# Patient Record
Sex: Male | Born: 1995 | Race: Black or African American | Hispanic: No | Marital: Single | Smoking: Never smoker
Health system: Southern US, Community
[De-identification: ages and names within clinical notes are randomized; demographics above are authoritative.]

---
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
## 2017-12-26 ENCOUNTER — Other Ambulatory Visit: Payer: Self-pay

## 2017-12-26 ENCOUNTER — Encounter (HOSPITAL_COMMUNITY): Payer: Self-pay | Admitting: Emergency Medicine

## 2017-12-26 ENCOUNTER — Observation Stay (HOSPITAL_COMMUNITY)
Admission: EM | Admit: 2017-12-26 | Discharge: 2017-12-27 | Disposition: A | Discharge status: Home / Self Care | Payer: Self-pay | Attending: Internal Medicine | Admitting: Internal Medicine

## 2017-12-26 DIAGNOSIS — E669 Obesity, unspecified: Secondary | ICD-10-CM

## 2017-12-26 DIAGNOSIS — Z9889 Other specified postprocedural states: Secondary | ICD-10-CM | POA: Insufficient documentation

## 2017-12-26 DIAGNOSIS — E0810 Diabetes mellitus due to underlying condition with ketoacidosis without coma: Secondary | ICD-10-CM

## 2017-12-26 DIAGNOSIS — N179 Acute kidney failure, unspecified: Secondary | ICD-10-CM | POA: Insufficient documentation

## 2017-12-26 DIAGNOSIS — Z23 Encounter for immunization: Secondary | ICD-10-CM | POA: Insufficient documentation

## 2017-12-26 DIAGNOSIS — E1110 Type 2 diabetes mellitus with ketoacidosis without coma: Secondary | ICD-10-CM

## 2017-12-26 DIAGNOSIS — E1010 Type 1 diabetes mellitus with ketoacidosis without coma: Principal | ICD-10-CM | POA: Insufficient documentation

## 2017-12-26 DIAGNOSIS — E860 Dehydration: Secondary | ICD-10-CM | POA: Insufficient documentation

## 2017-12-26 DIAGNOSIS — Z6841 Body Mass Index (BMI) 40.0 and over, adult: Secondary | ICD-10-CM | POA: Insufficient documentation

## 2017-12-26 DIAGNOSIS — E876 Hypokalemia: Secondary | ICD-10-CM | POA: Insufficient documentation

## 2017-12-26 LAB — COMPREHENSIVE METABOLIC PANEL
ALBUMIN: 4.9 g/dL (ref 3.5–5.0)
ALT: 45 U/L — ABNORMAL HIGH (ref 0–44)
AST: 25 U/L (ref 15–41)
Alkaline Phosphatase: 219 U/L — ABNORMAL HIGH (ref 38–126)
BUN: 29 mg/dL — ABNORMAL HIGH (ref 6–20)
CO2: 22 mmol/L (ref 22–32)
Calcium: 10.2 mg/dL (ref 8.9–10.3)
Chloride: 83 mmol/L — ABNORMAL LOW (ref 98–111)
Creatinine, Ser: 1.71 mg/dL — ABNORMAL HIGH (ref 0.61–1.24)
GFR calc non Af Amer: 56 mL/min — ABNORMAL LOW (ref >60)
GLUCOSE: 1174 mg/dL — AB (ref 70–99)
POTASSIUM: 6.5 mmol/L — AB (ref 3.5–5.1)
SODIUM: 127 mmol/L — AB (ref 135–145)
TOTAL PROTEIN: 8.8 g/dL — AB (ref 6.5–8.1)
Total Bilirubin: 1.4 mg/dL — ABNORMAL HIGH (ref 0.3–1.2)

## 2017-12-26 LAB — BASIC METABOLIC PANEL
Anion gap: >20 — ABNORMAL HIGH (ref 5–15)
Anion gap: 13 (ref 5–15)
Anion gap: 15 (ref 5–15)
BUN: 25 mg/dL — AB (ref 6–20)
BUN: 23 mg/dL — AB (ref 6–20)
BUN: 20 mg/dL (ref 6–20)
CALCIUM: 9.9 mg/dL (ref 8.9–10.3)
CO2: 24 mmol/L (ref 22–32)
CO2: 28 mmol/L (ref 22–32)
CO2: 25 mmol/L (ref 22–32)
Calcium: 9.8 mg/dL (ref 8.9–10.3)
Calcium: 9.2 mg/dL (ref 8.9–10.3)
Chloride: 90 mmol/L — ABNORMAL LOW (ref 98–111)
Chloride: 95 mmol/L — ABNORMAL LOW (ref 98–111)
Chloride: 97 mmol/L — ABNORMAL LOW (ref 98–111)
Creatinine, Ser: 1.44 mg/dL — ABNORMAL HIGH (ref 0.61–1.24)
Creatinine, Ser: 1.44 mg/dL — ABNORMAL HIGH (ref 0.61–1.24)
Creatinine, Ser: 1.1 mg/dL (ref 0.61–1.24)
GFR calc Af Amer: >60 mL/min (ref >60)
GFR calc Af Amer: >60 mL/min (ref >60)
GFR calc Af Amer: >60 mL/min (ref >60)
GLUCOSE: 413 mg/dL — AB (ref 70–99)
GLUCOSE: 287 mg/dL — AB (ref 70–99)
Glucose, Bld: 801 mg/dL (ref 70–99)
POTASSIUM: 4 mmol/L (ref 3.5–5.1)
POTASSIUM: 3.5 mmol/L (ref 3.5–5.1)
Potassium: 4.8 mmol/L (ref 3.5–5.1)
Sodium: 135 mmol/L (ref 135–145)
Sodium: 136 mmol/L (ref 135–145)
Sodium: 137 mmol/L (ref 135–145)

## 2017-12-26 LAB — CBC WITH DIFFERENTIAL/PLATELET
BASOS PCT: 0 %
Basophils Absolute: 0 10*3/uL (ref 0.0–0.1)
Eosinophils Absolute: 0.1 10*3/uL (ref 0.0–0.7)
Eosinophils Relative: 1 %
HEMATOCRIT: 47.9 % (ref 39.0–52.0)
HEMOGLOBIN: 17.6 g/dL — AB (ref 13.0–17.0)
LYMPHS ABS: 1.7 10*3/uL (ref 0.7–4.0)
Lymphocytes Relative: 25 %
MCH: 31 pg (ref 26.0–34.0)
MCHC: 36.7 g/dL — AB (ref 30.0–36.0)
MCV: 84.3 fL (ref 78.0–100.0)
MONOS PCT: 9 %
Monocytes Absolute: 0.6 10*3/uL (ref 0.1–1.0)
NEUTROS ABS: 4.4 10*3/uL (ref 1.7–7.7)
NEUTROS PCT: 65 %
Platelets: 276 10*3/uL (ref 150–400)
RBC: 5.68 MIL/uL (ref 4.22–5.81)
RDW: 12.2 % (ref 11.5–15.5)
WBC: 6.8 10*3/uL (ref 4.0–10.5)

## 2017-12-26 LAB — GLUCOSE, CAPILLARY
GLUCOSE-CAPILLARY: 297 mg/dL — AB (ref 70–99)
GLUCOSE-CAPILLARY: 302 mg/dL — AB (ref 70–99)
Glucose-Capillary: 277 mg/dL — ABNORMAL HIGH (ref 70–99)
Glucose-Capillary: 267 mg/dL — ABNORMAL HIGH (ref 70–99)
Glucose-Capillary: 248 mg/dL — ABNORMAL HIGH (ref 70–99)

## 2017-12-26 LAB — CBG MONITORING, ED
GLUCOSE-CAPILLARY: 528 mg/dL — AB (ref 70–99)
Glucose-Capillary: >600 mg/dL (ref 70–99)
Glucose-Capillary: 596 mg/dL (ref 70–99)

## 2017-12-26 LAB — URINALYSIS, ROUTINE W REFLEX MICROSCOPIC
BACTERIA UA: NONE SEEN
Bilirubin Urine: NEGATIVE
Hgb urine dipstick: NEGATIVE
Ketones, ur: 5 mg/dL — AB
Leukocytes, UA: NEGATIVE
NITRITE: NEGATIVE
PROTEIN: NEGATIVE mg/dL
Specific Gravity, Urine: 1.028 (ref 1.005–1.030)
pH: 6 (ref 5.0–8.0)

## 2017-12-26 LAB — MRSA PCR SCREENING: MRSA BY PCR: NEGATIVE

## 2017-12-26 LAB — LIPASE, BLOOD: Lipase: 31 U/L (ref 11–51)

## 2017-12-26 MED ORDER — SODIUM CHLORIDE 0.9 % IV BOLUS: 1000.0000 mL | Freq: Once | INTRAVENOUS | Status: AC

## 2017-12-26 MED ORDER — DEXTROSE-NACL 5-0.45 % IV SOLN: INTRAVENOUS | Status: DC

## 2017-12-26 MED ORDER — ACETAMINOPHEN 325 MG PO TABS: 650.0000 mg | ORAL_TABLET | Freq: Four times a day (QID) | ORAL | Status: DC | PRN

## 2017-12-26 MED ORDER — INSULIN ASPART 100 UNIT/ML IV SOLN: 10.0000 [IU] | Freq: Once | INTRAVENOUS | Status: AC

## 2017-12-26 MED ORDER — ONDANSETRON HCL 4 MG/2ML IJ SOLN
4.0000 mg | Freq: Once | INTRAMUSCULAR | Status: AC
  Administered 2017-12-26: 4 mg via INTRAVENOUS
  Filled 2017-12-26: qty 2

## 2017-12-26 MED ORDER — ONDANSETRON HCL 4 MG/2ML IJ SOLN: 4.0000 mg | Freq: Four times a day (QID) | INTRAMUSCULAR | Status: DC | PRN

## 2017-12-26 MED ORDER — ONDANSETRON HCL 4 MG PO TABS: 4.0000 mg | ORAL_TABLET | Freq: Four times a day (QID) | ORAL | Status: DC | PRN

## 2017-12-26 MED ORDER — HEPARIN SODIUM (PORCINE) 5000 UNIT/ML IJ SOLN
5000.0000 [IU] | Freq: Three times a day (TID) | INTRAMUSCULAR | Status: DC
Start: 2017-12-26 — End: 2017-12-27
  Administered 2017-12-26: 5000 [IU] via SUBCUTANEOUS
  Filled 2017-12-26: qty 1

## 2017-12-26 MED ORDER — SODIUM CHLORIDE 0.9 % IV SOLN
INTRAVENOUS | Status: DC
  Administered 2017-12-26: 5.4 [IU]/h via INTRAVENOUS
  Filled 2017-12-26: qty 1

## 2017-12-26 MED ORDER — SODIUM CHLORIDE 0.9 % IV BOLUS
1000.0000 mL | Freq: Once | INTRAVENOUS | Status: AC
  Administered 2017-12-26: 1000 mL via INTRAVENOUS

## 2017-12-26 MED ORDER — PNEUMOCOCCAL VAC POLYVALENT 25 MCG/0.5ML IJ INJ
0.5000 mL | INJECTION | INTRAMUSCULAR | Status: AC
  Administered 2017-12-27: 0.5 mL via INTRAMUSCULAR
  Filled 2017-12-26: qty 0.5

## 2017-12-26 MED ORDER — DEXTROSE 50 % IV SOLN: 25.0000 mL | INTRAVENOUS | Status: DC | PRN

## 2017-12-26 MED ORDER — SODIUM CHLORIDE 0.9 % IV SOLN
INTRAVENOUS | Status: DC
  Administered 2017-12-26: 16.1 [IU]/h via INTRAVENOUS
  Filled 2017-12-26 (×2): qty 1

## 2017-12-26 MED ORDER — SODIUM CHLORIDE 0.9 % IV SOLN
INTRAVENOUS | Status: DC
  Administered 2017-12-26 – 2017-12-27 (×3): via INTRAVENOUS

## 2017-12-26 MED ORDER — ACETAMINOPHEN 650 MG RE SUPP: 650.0000 mg | Freq: Four times a day (QID) | RECTAL | Status: DC | PRN

## 2017-12-26 MED ORDER — INSULIN REGULAR BOLUS VIA INFUSION
0.0000 [IU] | Freq: Three times a day (TID) | INTRAVENOUS | Status: DC
  Filled 2017-12-26: qty 10

## 2017-12-26 MED ORDER — POTASSIUM CHLORIDE 10 MEQ/100ML IV SOLN: 10.0000 meq | INTRAVENOUS | Status: AC

## 2017-12-26 MED ORDER — SODIUM CHLORIDE 0.9 % IV SOLN: INTRAVENOUS | Status: DC

## 2017-12-26 MED ORDER — DEXTROSE-NACL 5-0.45 % IV SOLN
INTRAVENOUS | Status: DC
  Administered 2017-12-26: 23:00:00 via INTRAVENOUS

## 2017-12-26 NOTE — ED Triage Notes (Signed)
Patient complaining of vomiting x 1 week. Also states he fell down approximately 6 steps yesterday and is complaining of lower back pain.

## 2017-12-26 NOTE — ED Notes (Signed)
Date and time results received: 12/26/17 1100 (use smartphrase ".now" to insert current time)  Test: k+ and glucose Critical Value: 1174  Name of Provider Notified: triplett  Orders Received? Or Actions Taken?: see new orders

## 2017-12-26 NOTE — H&P (Signed)
History and Physical    Joel Hays NWG:[id number] DOB: 1996/04/06 DOA: 12/26/2017  PCP: Patient, No Pcp Per   Patient coming from: Home  Chief Complaint: Nausea and vomiting  HPI: Joel Hays is a 22 y.o. male with medical history significant for obesity and what sounds like previously diagnosed type 2 diabetes at the age of 7 who is not currently on any medications.  He came to the ED with complaints of ongoing nausea and vomiting for the last 1 week.  He also was somewhat lightheaded and dizzy at home and had a fall, but denies any pain complaints or concerns.  He states that he has also had a dry mouth. He denies any abdominal pain, chest pain, lower extremity edema, shortness of breath, wheezing, fever, or chills.   ED Course: Vital signs are noted to be stable and laboratory data demonstrates a glucose of 1174mg /dL and sodium of 127mEq.  Sodium corrected is and anion gap is 48 with corrected sodium level.  Urine analysis does not demonstrate any significant findings aside from some mild ketones.  He has thus far been given 2 L fluid bolus and has been started on normal saline at 125 cc/h.  He has also been started on an insulin drip.  Repeat Accu-Chek demonstrates blood glucose still greater than 600mg /dL.  He states, however that his nausea is beginning to improve.  Review of Systems: All others reviewed and otherwise negative.  History reviewed. No pertinent past medical history.  History reviewed. No pertinent surgical history.  No tobacco, uses alcohol once or twice a month; No illicits.  No Known Allergies  History reviewed. No pertinent family history.  Prior to Admission medications   Not on File    Physical Exam: Vitals:   12/26/17 0901 12/26/17 0902 12/26/17 1149  BP: (!) 165/95    Pulse: (!) 125    Resp: 20    Temp: 97.8 F (36.6 C)    TempSrc: Oral    SpO2: 95%    Weight:  136.1 kg (300 lb) (!) 158.1 kg (348 lb 9.6 oz)  Height:  6\' 3"  (1.905  m) 6\' 3"  (1.905 m)    Constitutional: NAD, calm, comfortable; obese Vitals:   12/26/17 0901 12/26/17 0902 12/26/17 1149  BP: (!) 165/95    Pulse: (!) 125    Resp: 20    Temp: 97.8 F (36.6 C)    TempSrc: Oral    SpO2: 95%    Weight:  136.1 kg (300 lb) (!) 158.1 kg (348 lb 9.6 oz)  Height:  6\' 3"  (1.905 m) 6\' 3"  (1.905 m)   Eyes: lids and conjunctivae normal ENMT: Mucous membranes are moist.  Neck: normal, supple Respiratory: clear to auscultation bilaterally. Normal respiratory effort. No accessory muscle use.  Cardiovascular: Regular rate and rhythm, no murmurs. No extremity edema. Abdomen: no tenderness, no distention. Bowel sounds positive.  Musculoskeletal:  No joint deformity upper and lower extremities.   Skin: no rashes, lesions, ulcers.  Psychiatric: Normal judgment and insight. Alert and oriented x 3. Normal mood.   Labs on Admission: I have personally reviewed following labs and imaging studies  CBC: Recent Labs  Lab 12/26/17 0931  WBC 6.8  NEUTROABS 4.4  HGB 17.6*  HCT 47.9  MCV 84.3  PLT 276   Basic Metabolic Panel: Recent Labs  Lab 12/26/17 0931  NA 127*  K 6.5*  CL 83*  CO2 22  GLUCOSE 1,174*  BUN 29*  CREATININE 1.71*  CALCIUM  10.2   GFR: Estimated Creatinine Clearance: 110.1 mL/min (A) (by C-G formula based on SCr of 1.71 mg/dL (H)). Liver Function Tests: Recent Labs  Lab 12/26/17 0931  AST 25  ALT 45*  ALKPHOS 219*  BILITOT 1.4*  PROT 8.8*  ALBUMIN 4.9   Recent Labs  Lab 12/26/17 0931  LIPASE 31   No results for input(s): AMMONIA in the last 168 hours. Coagulation Profile: No results for input(s): INR, PROTIME in the last 168 hours. Cardiac Enzymes: No results for input(s): CKTOTAL, CKMB, CKMBINDEX, TROPONINI in the last 168 hours. BNP (last 3 results) No results for input(s): PROBNP in the last 8760 hours. HbA1C: No results for input(s): HGBA1C in the last 72 hours. CBG: Recent Labs  Lab 12/26/17 1154  GLUCAP >600*     Lipid Profile: No results for input(s): CHOL, HDL, LDLCALC, TRIG, CHOLHDL, LDLDIRECT in the last 72 hours. Thyroid Function Tests: No results for input(s): TSH, T4TOTAL, FREET4, T3FREE, THYROIDAB in the last 72 hours. Anemia Panel: No results for input(s): VITAMINB12, FOLATE, FERRITIN, TIBC, IRON, RETICCTPCT in the last 72 hours. Urine analysis:    Component Value Date/Time   COLORURINE COLORLESS (A) 12/26/2017 0931   APPEARANCEUR CLEAR 12/26/2017 0931   LABSPEC 1.028 12/26/2017 0931   PHURINE 6.0 12/26/2017 0931   GLUCOSEU >=500 (A) 12/26/2017 0931   HGBUR NEGATIVE 12/26/2017 0931   BILIRUBINUR NEGATIVE 12/26/2017 0931   KETONESUR 5 (A) 12/26/2017 0931   PROTEINUR NEGATIVE 12/26/2017 0931   NITRITE NEGATIVE 12/26/2017 0931   LEUKOCYTESUR NEGATIVE 12/26/2017 0931    Radiological Exams on Admission: No results found.   Assessment/Plan Principal Problem:   DKA (diabetic ketoacidoses) (HCC) Active Problems:   Obesity    1. DKA secondary to new onset type 2 diabetes.  Plan to maintain on glucose stabilizer protocol with repeat BMP every 4 hours as well as Accu-Cheks q. one hours.  Maintain on normal saline with additional 1 L fluid bolus at this time.  Plan for transition to D5 half-normal saline with some potassium once glucose less than 250.  Nausea appears to have improved, but anion gap is still quite elevated at 48 calculated.  We will plan to feed patient once anion gap has resolved on subsequent BMP.  Check hemoglobin A1c and consult to diabetes coordinator.  No acute respiratory distress at this time and will avoid ABG. 2. AKI secondary to above.  Plan to continue with fluid hydration and avoid nephrotoxic agents.  Repeat renal panel in a.m.  Monitor I's and O's. 3. Obesity.  Will need weight loss counseling once in the outpatient setting.   DVT prophylaxis: Heparin Code Status: Full Family Communication: Mother at bedside Disposition Plan:DKA treatment; DM  management Consults called:None Admission status: Observation, SDU   Joel Koppen Hoover BrunetteD Earnie Bechard DO [facility] Pager [phone]  If 7PM-7AM, please contact night-coverage [email or web address] Password [email or web address]  12/26/2017, 12:13 PM

## 2017-12-26 NOTE — ED Notes (Signed)
Date and time results received: 12/26/17 1131 (use smartphrase ".now" to insert current time)  Test: trop Critical Value: 2.51  Name of Provider Notified: bridges   Orders Received? Or Actions Taken?: none

## 2017-12-26 NOTE — ED Notes (Signed)
Pt was informed that we need a urine sample. 

## 2017-12-26 NOTE — ED Notes (Signed)
Insulin gtt increased to 10.8units/hr

## 2017-12-26 NOTE — ED Provider Notes (Signed)
[facility] EMERGENCY DEPARTMENT Provider Note   CSN: [id number] Arrival date & time: 12/26/17  4098     History   Chief Complaint Chief Complaint  Patient presents with  . Emesis    HPI Joel Hays is a 22 y.o. male.  HPI   Joel Hays is a 22 y.o. male who presents to the Emergency Department complaining of persistent vomiting for one week.  He states that he is only able to down small amounts of water and jello.  He describes excessive vomiting of watery material.  He also complains of excessive thirst, no weight loss.  Also admits to some generalized weakness.  Patient's mother reports history of diabetes as a child, but states he was not started on insulin and she believes he was given metformin.  He denies diarrhea, fever, chills, abdominal pain, shortness of breath and decreased appetite.  He has not tried any over-the-counter medications for symptom relief.  Mother reports significant family history of diabetes.    History reviewed. No pertinent past medical history.  There are no active problems to display for this patient.   History reviewed. No pertinent surgical history.      Home Medications    Prior to Admission medications   Not on File    Family History History reviewed. No pertinent family history.  Social History Social History   Tobacco Use  . Smoking status: Never Smoker  . Smokeless tobacco: Never Used  Substance Use Topics  . Alcohol use: No  . Drug use: No     Allergies   Patient has no known allergies.   Review of Systems Review of Systems  Constitutional: Negative for appetite change, chills and fever.  HENT: Negative for sore throat and trouble swallowing.   Respiratory: Negative for chest tightness and shortness of breath.   Cardiovascular: Negative for chest pain.  Gastrointestinal: Positive for nausea and vomiting. Negative for abdominal pain, blood in stool and diarrhea.  Genitourinary: Negative for decreased  urine volume, difficulty urinating, dysuria and flank pain.  Musculoskeletal: Negative for back pain.  Skin: Negative for color change and rash.  Neurological: Negative for dizziness, weakness and numbness.  Hematological: Negative for adenopathy.  All other systems reviewed and are negative.    Physical Exam Updated Vital Signs BP (!) 165/95 (BP Location: Right Arm)   Pulse (!) 125   Temp 97.8 F (36.6 C) (Oral)   Resp 20   Ht 6\' 3"  (1.905 m)   Wt 136.1 kg (300 lb)   SpO2 95%   BMI 37.50 kg/m   Physical Exam  Constitutional: He appears well-nourished.  HENT:  Head: Atraumatic.  Mucous membranes are dry  Neck: Normal range of motion. No JVD present.  Cardiovascular: Intact distal pulses. Tachycardia present.  Pulmonary/Chest: Effort normal. No respiratory distress.  Abdominal: Soft. He exhibits no distension. There is no tenderness. There is no guarding.  Musculoskeletal: Normal range of motion.  Neurological: He is alert. No sensory deficit.  Skin: Skin is warm. Capillary refill takes less than 2 seconds. No rash noted.  Nursing note and vitals reviewed.    ED Treatments / Results  Labs (all labs ordered are listed, but only abnormal results are displayed) Labs Reviewed  COMPREHENSIVE METABOLIC PANEL - Abnormal; Notable for the following components:      Result Value   Sodium 127 (*)    Potassium 6.5 (*)    Chloride 83 (*)    Glucose, Bld 1,174 (*)  BUN 29 (*)    Creatinine, Ser 1.71 (*)    Total Protein 8.8 (*)    ALT 45 (*)    Alkaline Phosphatase 219 (*)    Total Bilirubin 1.4 (*)    GFR calc non Af Amer 56 (*)    All other components within normal limits  CBC WITH DIFFERENTIAL/PLATELET - Abnormal; Notable for the following components:   Hemoglobin 17.6 (*)    MCHC 36.7 (*)    All other components within normal limits  URINALYSIS, ROUTINE W REFLEX MICROSCOPIC - Abnormal; Notable for the following components:   Color, Urine COLORLESS (*)    Glucose,  UA >=500 (*)    Ketones, ur 5 (*)    All other components within normal limits  LIPASE, BLOOD  CBG MONITORING, ED    EKG None  Radiology No results found.  Procedures Procedures (including critical care time)  Medications Ordered in ED Medications  sodium chloride 0.9 % bolus 1,000 mL (1,000 mLs Intravenous New Bag/Given 12/26/17 1105)  insulin regular bolus via infusion 0-10 Units (has no administration in time range)  insulin regular (NOVOLIN R,HUMULIN R) 100 Units in sodium chloride 0.9 % 100 mL (1 Units/mL) infusion (has no administration in time range)  dextrose 50 % solution 25 mL (has no administration in time range)  0.9 %  sodium chloride infusion (has no administration in time range)  dextrose 5 %-0.45 % sodium chloride infusion (has no administration in time range)  sodium chloride 0.9 % bolus 1,000 mL (0 mLs Intravenous Stopped 12/26/17 1104)  ondansetron (ZOFRAN) injection 4 mg (4 mg Intravenous Given 12/26/17 1008)     Initial Impression / Assessment and Plan / ED Course  I have reviewed the triage vital signs and the nursing notes.  Pertinent labs & imaging results that were available during my care of the patient were reviewed by me and considered in my medical decision making (see chart for details).      Pt with hx of persistent vomiting.  Abdomen is soft and non-tender.  No distention.  Mother reports significant family hx of DM, will check labs.   Notified by nursing staff of pt's glucose level.  Possible DKA.  Will give IVF's and initiate glucose stabilizer and consult for admission   CRITICAL CARE Performed by: Rustin Erhart Total critical care time: 30 minutes Critical care time was exclusive of separately billable procedures and treating other patients. Critical care was necessary to treat or prevent imminent or life-threatening deterioration. Critical care was time spent personally by me on the following activities: development of treatment plan with  patient and/or surrogate as well as nursing, discussions with consultants, evaluation of patient's response to treatment, examination of patient, obtaining history from patient or surrogate, ordering and performing treatments and interventions, ordering and review of laboratory studies, ordering and review of radiographic studies, pulse oximetry and re-evaluation of patient's condition.   1145  Consulted hospitalist, Dr. Sherryll BurgerShah, who agrees to admit  Final Clinical Impressions(s) / ED Diagnoses   Final diagnoses:  None    ED Discharge Orders    None       Pauline Ausriplett, Kelicia Youtz, PA-C 12/26/17 1744    Samuel JesterMcManus, Kathleen, DO 12/29/17 [phone]

## 2017-12-27 DIAGNOSIS — E876 Hypokalemia: Secondary | ICD-10-CM

## 2017-12-27 DIAGNOSIS — Z6841 Body Mass Index (BMI) 40.0 and over, adult: Secondary | ICD-10-CM

## 2017-12-27 DIAGNOSIS — E1110 Type 2 diabetes mellitus with ketoacidosis without coma: Secondary | ICD-10-CM

## 2017-12-27 LAB — GLUCOSE, CAPILLARY
GLUCOSE-CAPILLARY: 169 mg/dL — AB (ref 70–99)
GLUCOSE-CAPILLARY: 157 mg/dL — AB (ref 70–99)
GLUCOSE-CAPILLARY: 162 mg/dL — AB (ref 70–99)
GLUCOSE-CAPILLARY: 201 mg/dL — AB (ref 70–99)
GLUCOSE-CAPILLARY: 236 mg/dL — AB (ref 70–99)
Glucose-Capillary: 154 mg/dL — ABNORMAL HIGH (ref 70–99)
Glucose-Capillary: 159 mg/dL — ABNORMAL HIGH (ref 70–99)
Glucose-Capillary: 160 mg/dL — ABNORMAL HIGH (ref 70–99)
Glucose-Capillary: 162 mg/dL — ABNORMAL HIGH (ref 70–99)
Glucose-Capillary: 180 mg/dL — ABNORMAL HIGH (ref 70–99)
Glucose-Capillary: 192 mg/dL — ABNORMAL HIGH (ref 70–99)
Glucose-Capillary: 226 mg/dL — ABNORMAL HIGH (ref 70–99)
Glucose-Capillary: 276 mg/dL — ABNORMAL HIGH (ref 70–99)
Glucose-Capillary: 378 mg/dL — ABNORMAL HIGH (ref 70–99)

## 2017-12-27 LAB — CBC
HCT: 44.1 % (ref 39.0–52.0)
HEMOGLOBIN: 15.5 g/dL (ref 13.0–17.0)
MCH: 30.2 pg (ref 26.0–34.0)
MCHC: 35.1 g/dL (ref 30.0–36.0)
MCV: 86 fL (ref 78.0–100.0)
Platelets: 252 10*3/uL (ref 150–400)
RBC: 5.13 MIL/uL (ref 4.22–5.81)
RDW: 12.5 % (ref 11.5–15.5)
WBC: 7.1 10*3/uL (ref 4.0–10.5)

## 2017-12-27 LAB — BASIC METABOLIC PANEL
ANION GAP: 11 (ref 5–15)
ANION GAP: 12 (ref 5–15)
Anion gap: 12 (ref 5–15)
Anion gap: 12 (ref 5–15)
BUN: 13 mg/dL (ref 6–20)
BUN: 14 mg/dL (ref 6–20)
BUN: 16 mg/dL (ref 6–20)
BUN: 15 mg/dL (ref 6–20)
CALCIUM: 8.9 mg/dL (ref 8.9–10.3)
CALCIUM: 8.8 mg/dL — AB (ref 8.9–10.3)
CO2: 26 mmol/L (ref 22–32)
CO2: 27 mmol/L (ref 22–32)
CO2: 28 mmol/L (ref 22–32)
CO2: 28 mmol/L (ref 22–32)
CREATININE: 1 mg/dL (ref 0.61–1.24)
CREATININE: 1.16 mg/dL (ref 0.61–1.24)
Calcium: 8.9 mg/dL (ref 8.9–10.3)
Calcium: 8.9 mg/dL (ref 8.9–10.3)
Chloride: 97 mmol/L — ABNORMAL LOW (ref 98–111)
Chloride: 97 mmol/L — ABNORMAL LOW (ref 98–111)
Chloride: 97 mmol/L — ABNORMAL LOW (ref 98–111)
Chloride: 99 mmol/L (ref 98–111)
Creatinine, Ser: 1.03 mg/dL (ref 0.61–1.24)
Creatinine, Ser: 1.11 mg/dL (ref 0.61–1.24)
GFR calc Af Amer: >60 mL/min (ref >60)
GFR calc Af Amer: >60 mL/min (ref >60)
GFR calc Af Amer: >60 mL/min (ref >60)
GFR calc non Af Amer: >60 mL/min (ref >60)
GLUCOSE: 161 mg/dL — AB (ref 70–99)
GLUCOSE: 265 mg/dL — AB (ref 70–99)
Glucose, Bld: 168 mg/dL — ABNORMAL HIGH (ref 70–99)
Glucose, Bld: 190 mg/dL — ABNORMAL HIGH (ref 70–99)
POTASSIUM: 3.1 mmol/L — AB (ref 3.5–5.1)
POTASSIUM: 2.8 mmol/L — AB (ref 3.5–5.1)
POTASSIUM: 2.7 mmol/L — AB (ref 3.5–5.1)
Potassium: 3.3 mmol/L — ABNORMAL LOW (ref 3.5–5.1)
SODIUM: 138 mmol/L (ref 135–145)
Sodium: 135 mmol/L (ref 135–145)
Sodium: 136 mmol/L (ref 135–145)
Sodium: 137 mmol/L (ref 135–145)

## 2017-12-27 LAB — HIV ANTIBODY (ROUTINE TESTING W REFLEX): HIV SCREEN 4TH GENERATION: NONREACTIVE

## 2017-12-27 LAB — MAGNESIUM: MAGNESIUM: 2.1 mg/dL (ref 1.7–2.4)

## 2017-12-27 MED ORDER — INSULIN ASPART 100 UNIT/ML ~~LOC~~ SOLN
0.0000 [IU] | Freq: Three times a day (TID) | SUBCUTANEOUS | Status: DC
  Administered 2017-12-27: 11 [IU] via SUBCUTANEOUS
  Administered 2017-12-27: 7 [IU] via SUBCUTANEOUS

## 2017-12-27 MED ORDER — INSULIN ASPART 100 UNIT/ML ~~LOC~~ SOLN: 0.0000 [IU] | Freq: Every day | SUBCUTANEOUS | Status: DC

## 2017-12-27 MED ORDER — INSULIN PEN NEEDLE 31G X 5 MM MISC: 0 refills | Status: AC

## 2017-12-27 MED ORDER — BLOOD GLUCOSE MONITOR KIT: PACK | 0 refills | Status: AC

## 2017-12-27 MED ORDER — INSULIN ISOPHANE & REGULAR (HUMAN 70-30)100 UNIT/ML KWIKPEN: PEN_INJECTOR | SUBCUTANEOUS | 5 refills | Status: AC

## 2017-12-27 MED ORDER — INSULIN REGULAR HUMAN 100 UNIT/ML IJ SOLN
INTRAMUSCULAR | Status: AC
  Filled 2017-12-27: qty 1

## 2017-12-27 MED ORDER — LIVING WELL WITH DIABETES BOOK
Freq: Once | Status: AC
  Administered 2017-12-27: 10:00:00
  Filled 2017-12-27: qty 1

## 2017-12-27 MED ORDER — INSULIN GLARGINE 100 UNIT/ML ~~LOC~~ SOLN
16.0000 [IU] | Freq: Two times a day (BID) | SUBCUTANEOUS | Status: DC
  Administered 2017-12-27: 16 [IU] via SUBCUTANEOUS
  Filled 2017-12-27 (×3): qty 0.16

## 2017-12-27 MED ORDER — POTASSIUM CHLORIDE CRYS ER 20 MEQ PO TBCR
40.0000 meq | EXTENDED_RELEASE_TABLET | ORAL | Status: DC
  Administered 2017-12-27 (×3): 40 meq via ORAL
  Filled 2017-12-27 (×3): qty 2

## 2017-12-27 MED ORDER — INSULIN STARTER KIT- PEN NEEDLES (ENGLISH)
1.0000 | Freq: Once | Status: AC
  Administered 2017-12-27: 1
  Filled 2017-12-27: qty 1

## 2017-12-27 MED ORDER — INSULIN ASPART 100 UNIT/ML ~~LOC~~ SOLN
3.0000 [IU] | Freq: Three times a day (TID) | SUBCUTANEOUS | Status: DC
  Administered 2017-12-27 (×2): 3 [IU] via SUBCUTANEOUS

## 2017-12-27 NOTE — Progress Notes (Addendum)
Inpatient Diabetes Program Recommendations  AACE/ADA: New Consensus Statement on Inpatient Glycemic Control (2015)  Target Ranges:  Prepandial:   less than 140 mg/dL      Peak postprandial:   less than 180 mg/dL (1-2 hours)      Critically ill patients:  140 - 180 mg/dL   Lab Results  Component Value Date   GLUCAP 162 (H) 12/27/2017    Review of Glycemic Control  Diabetes history: DM2 (new onset age 22) Outpatient Diabetes medications: None Current orders for Inpatient glycemic control: IV insulin  Inpatient Diabetes Program Recommendations:   Noted patient was diagnosed DM2 @ age 22 but has not been on medications. When ready to transition from IV insulin: -Transition to Lantus 32 units (0.2 units/kg x 163 kg) -Novolog 5 units tid meal coverage if eats 50%  Spoke with patient by phone and discussed hx. Patient had N & V x 1 week but had dry mouth x 1 week and drinking large amount of gatorade and juices. Patient was diagnosed age 22 DM2 and placed on Metformin then D/C'd later due to not needing. Spoke with patient concerning no insurance. Placed referrals for case mgt and dietician. Patient prefers insulin pen and willing to purchase Novolin Relion insulin 70/30 from [facility] for approx. $40. Will also order insulin pen starter kit. Novolin 70/30 23 units bid ac meals would = approx. 32 units basal + 14 units meal coverage. Awaiting A1c results. Spoke with RN Tiffany and plans to start teaching about DM. Will plan to see pt today. 11:00 Educated patient and mom on insulin pen use at home. Reviewed contents of insulin flexpen starter kit. Reviewed all steps if insulin pen including attachment of needle, 2-unit air shot, dialing up dose, giving injection, removing needle, disposal of sharps, storage of unused insulin, disposal of insulin etc. Patient able to provide successful return demonstration. Also reviewed troubleshooting with insulin pen. MD to give patient Rxs for insulin pens and  insulin pen needles.  Thank you, Nani Gasser. Cylan Borum, RN, MSN, CDE  Diabetes Coordinator Inpatient Glycemic Control Team Team Pager [phone] (8am-5pm) 12/27/2017 8:42 AM

## 2017-12-27 NOTE — Progress Notes (Signed)
CRITICAL VALUE ALERT  Critical Value:  Potassium 2.7  Date & Time Notied:  12/27/17  Provider Notified: Dr. Gwenlyn PerkingMadera  Orders Received/Actions taken: No new orders currently

## 2017-12-27 NOTE — Progress Notes (Signed)
Pt refuses to keep blood pressure cuff on for bp monitoring.

## 2017-12-27 NOTE — Plan of Care (Signed)
  RD consulted for nutrition education regarding diabetes.   Patient has been following a regular diet including drinking concentrated sugar beverages juices and regular soft drinks. Patient has been reading the booklet the diabetes coordinator ordered and his nurse has also been providing education.   We talked about why carbohydrate counting is important for him, which foods contain carbs, meal planning, reviewed label reading and set 2 goals to get him started.   Goals: Patient will substitute sugar free beverages for regular soft drinks and juices. 2.   Patient will educate himself by developing a habit of label reading so that he make informed choices.  No results found for: HGBA1C  RD provided "Carbohydrate Counting for People with Diabetes" handout from the Academy of Nutrition and Dietetics. Discussed different food groups and their effects on blood sugar, emphasizing carbohydrate-containing foods. Provided list of carbohydrates and recommended serving sizes of common foods.  Discussed importance of controlled and consistent carbohydrate intake throughout the day. Provided examples of ways to balance meals/snacks and encouraged intake of high-fiber, whole grain complex carbohydrates.   Teach back method used. Expect good compliance.  Patient is reading the "Living Well with Diabetes" booklet and had several questions today. He was very receptive to receiving education and engaged during the process.  Current diet order is  Cho modified, patient is consuming approximately >75%% of meals at this time.   Labs and medications reviewed. BMP Latest Ref Rng & Units 12/27/2017 12/27/2017 12/27/2017  Glucose 70 - 99 mg/dL 161(W265(H) 960(A161(H) 540(J168(H)  BUN 6 - 20 mg/dL 14 13 15   Creatinine 0.61 - 1.24 mg/dL 8.111.16 9.141.03 7.821.00  Sodium 135 - 145 mmol/L 135 137 138  Potassium 3.5 - 5.1 mmol/L 3.3(L) 2.7(LL) 2.8(L)  Chloride 98 - 111 mmol/L 97(L) 97(L) 99  CO2 22 - 32 mmol/L 26 28 27   Calcium 8.9 - 10.3 mg/dL  9.5(A8.8(L) 8.9 8.9    Body mass index is 45 kg/m. Pt meets criteria for morbid obesity based on current BMI.  RD contact information provided. If additional nutrition issues arise, please re-consult RD.  Royann ShiversLynn Sequoia Mincey MS,RD,CSG,LDN Office: [phone] Pager: [phone]

## 2017-12-27 NOTE — Care Management Note (Signed)
Case Management Note  Patient Details  Name: Joel Hays MRN: [id number] Date of Birth: Oct 16, 1995  Subjective/Objective:  DKA. From home. No PCP or insurance. Reports he is not working but reports he wants to use insulin pens and knows this is more expensive than vial/syringe method but is ok with cost.  Mother at bedside.  Financial counselor has met with patient.  Provided patient with [facility]/PCP information.                   Action/Plan: DC home. Will call [facility] for PCP. Dietician consulted.   Expected Discharge Date:   12/27/2017               Expected Discharge Plan:  Home/Self Care  In-House Referral:     Discharge planning Services  CM Consult, Other - See comment(PCP list)  Post Acute Care Choice:    Choice offered to:  Patient  DME Arranged:    DME Agency:     HH Arranged:    [place] Agency:     Status of Service:  Completed, signed off  If discussed at [employer] of Stay Meetings, dates discussed:    Additional Comments:  Loyalty Brashier, Chauncey Reading, RN 12/27/2017, 1:32 PM

## 2017-12-27 NOTE — Discharge Summary (Signed)
Physician Discharge Summary  SHANDY VI SWF:[id number] DOB: [birth year] DOA: 12/26/2017  PCP: Patient, No Pcp Per  Admit date: 12/26/2017 Discharge date: 12/27/2017  Time spent: 35 minutes  Recommendations for Outpatient Follow-up:  -Follow CBGs closely and further adjust hypoglycemic regimen as needed. -Repeat basic metabolic panel to follow electrolytes and renal function. -Check microalbuminuria and if needed immediately start low dose ACE or ARB for renal protection.  Discharge Diagnoses:  DKA (diabetic ketoacidoses) ([place]) AKI Obesity Hypokalemia   Discharge Condition: Stable and improved.  Patient discharged home with prescription for insulin therapy, instruction to keep himself well-hydrated and to follow modified carbohydrate diet.  He will establish care with PCP to further adjust hypoglycemic regimen and follow his CBGs.  Diet recommendation: Low calorie and modified carbohydrate diet.  Filed Weights   12/26/17 0902 12/26/17 1149 12/27/17 0500  Weight: 136.1 kg (300 lb) (!) 158.1 kg (348 lb 9.6 oz) (!) 163.3 kg (360 lb 0.2 oz)    History of present illness:  As per H&P written by Dr. Manuella Ghazi on 12/26/2017. 22 y.o. male with medical history significant for obesity and what sounds like previously diagnosed type 2 diabetes at the age of 2 who is not currently on any medications.  He came to the ED with complaints of ongoing nausea and vomiting for the last 1 week.  He also was somewhat lightheaded and dizzy at home and had a fall, but denies any pain complaints or concerns.  He states that he has also had a dry mouth. He denies any abdominal pain, chest pain, lower extremity edema, shortness of breath, wheezing, fever, or chills.   ED Course: Vital signs are noted to be stable and laboratory data demonstrates a glucose of 1161m/dL and sodium of 1255m.  Sodium corrected is 15370mand anion gap is 48 with corrected sodium level.  Urine analysis does not demonstrate any significant  findings aside from some mild ketones.     Hospital Course:  1-DKA and new onset diabetes.  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 -He was initially on insulin drip onto DKA process resolved. -A1c even pending at the moment of discharge his high level of sugar on presentation of DKA will warrant treatment with insulin therapy at this moment. -Patient will be started on 70/30 25 units twice a day and instruction to keep himself well-hydrated and follow modified carbohydrates diet. -Case manager has assisted patient with information to establish care with her PCP provider to further follow his CBGs and provide further adjustment to hypoglycemic regimen. -insulin Pen, pen needles and glucometer kit ordered at discharge  2-acute kidney injury: Prerenal in nature in the setting of dehydration from DKA/hyperglycemia process. -Renal function essentially back to normal range after fluid resuscitation. -would be important at follow up visit to check microalbuminuria and start ACE or ARB for renal protection.  3-morbid obesity  -Low calorie diet, portion control and increase physical therapy has been discussed with patient -Body mass index is 45 kg/m.  4-hypokalemia -in the setting of insulin therapy -repleted  -will need BMET at follow up visit to assess electrolytes trend   Procedures:  None   Consultations:  Diabetes coordinator   Discharge Exam: Vitals:   12/27/17 1500 12/27/17 1600  BP:    Pulse: (!) 110 (!) 105  Resp: 18 18  Temp:    SpO2: 99% 98%    General: Obese, in no distress, denies nausea, no vomiting, tolerating diet and feeling back to his baseline.  Cardiovascular: S1 and S2, no rubs, no gallops, no JVD. Respiratory: Clear to auscultation bilaterally, no wheezing, no crackles. Abdomen: Soft, nontender, nondistended, positive bowel sounds. Extremities: No edema, no cyanosis, no clubbing.  Discharge Instructions   Discharge Instructions    Diet  Carb Modified   Complete by:  As directed    Discharge instructions   Complete by:  As directed    Take medications as prescribed Arrange follow up with a PCP to further follow CBG's and adjust hypoglycemic regimen. Keep yourself well hydrated  Follow low calorie diet and portion control     Allergies as of 12/27/2017   No Known Allergies     Medication List    TAKE these medications   blood glucose meter kit and supplies Kit Dispense based on patient and insurance preference. Use up to four times daily as directed. (FOR ICD-9 250.00, 250.01).   Insulin Isophane & Regular Human (70-30) 100 UNIT/ML PEN Commonly known as:  NOVOLIN 70/30 FLEXPEN RELION Inject 25 units twice a day.   Insulin Pen Needle 31G X 5 MM Misc Use 1 pen-needle to inject insulin twice a day as prescribed       Microbiology: Recent Results (from the past 240 hour(s))  MRSA PCR Screening     Status: None   Collection Time: 12/26/17  5:30 PM  Result Value Ref Range Status   MRSA by PCR NEGATIVE NEGATIVE Final    Comment:        The GeneXpert MRSA Assay (FDA approved for NASAL specimens only), is one component of a comprehensive MRSA colonization surveillance program. It is not intended to diagnose MRSA infection nor to guide or monitor treatment for MRSA infections. Performed at [facility], [address], [place], [place] [address]      Labs: Basic Metabolic Panel: Recent Labs  Lab 12/26/17 2019 12/27/17 0024 12/27/17 0355 12/27/17 0808 12/27/17 1354  NA 137 136 138 137 135  K 3.5 3.1* 2.8* 2.7* 3.3*  CL 97* 97* 99 97* 97*  CO2 25 28 27 28 26   GLUCOSE 287* 190* 168* 161* 265*  BUN 20 16 15 13 14   CREATININE 1.10 1.11 1.00 1.03 1.16  CALCIUM 9.2 8.9 8.9 8.9 8.8*  MG  --   --  2.1  --   --    Liver Function Tests: Recent Labs  Lab 12/26/17 0931  AST 25  ALT 45*  ALKPHOS 219*  BILITOT 1.4*  PROT 8.8*  ALBUMIN 4.9   Recent Labs  Lab 12/26/17 0931  LIPASE 31    CBC: Recent Labs  Lab 12/26/17 0931 12/27/17 0355  WBC 6.8 7.1  NEUTROABS 4.4  --   HGB 17.6* 15.5  HCT 47.9 44.1  MCV 84.3 86.0  PLT 276 252   CBG: Recent Labs  Lab 12/27/17 0848 12/27/17 1026 12/27/17 1143 12/27/17 1320 12/27/17 1557  GLUCAP 201* 154* 159* 236* 276*    Signed:  Barton Dubois MD.  [facility] 12/27/2017, 4:47 PM

## 2017-12-28 LAB — HEMOGLOBIN A1C
HEMOGLOBIN A1C: 13.5 % — AB (ref 4.8–5.6)
Mean Plasma Glucose: 341 mg/dL

## 2022-05-12 ENCOUNTER — Encounter: Payer: Self-pay | Admitting: Emergency Medicine

## 2022-05-12 ENCOUNTER — Emergency Department: Payer: 59

## 2022-05-12 ENCOUNTER — Other Ambulatory Visit: Payer: Self-pay

## 2022-05-12 ENCOUNTER — Telehealth: Payer: Self-pay | Admitting: Emergency Medicine

## 2022-05-12 ENCOUNTER — Emergency Department
Admission: EM | Admit: 2022-05-12 | Discharge: 2022-05-12 | Discharge status: Against Medical Advice | Payer: 59 | Attending: Emergency Medicine | Admitting: Emergency Medicine

## 2022-05-12 DIAGNOSIS — Z5321 Procedure and treatment not carried out due to patient leaving prior to being seen by health care provider: Secondary | ICD-10-CM | POA: Insufficient documentation

## 2022-05-12 DIAGNOSIS — G501 Atypical facial pain: Secondary | ICD-10-CM | POA: Insufficient documentation
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
## 2022-05-12 NOTE — Progress Notes (Signed)
PT uncooperative for CT exams, initial exam was motion degraded, follow up repeat exams also motion degraded. PT refused to be scanned anymore.

## 2022-05-12 NOTE — ED Notes (Signed)
Dr Katrinka Blazing out to exam and speak with pt
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
## 2022-05-12 NOTE — Telephone Encounter (Signed)
Called patient due to left emergency department before provider exam to inquire about condition and follow up plans. Patient had poss nasal fx.  No answer and VM is full

## 2022-05-12 NOTE — ED Notes (Addendum)
Pt returns from CT; apparently pt uncooperative during attempts at imaging and does not follow commands to hold still for clear pictures; Dr Adaline Sill notified
